# Patient Record
Sex: Male | Born: 1955
Health system: Southern US, Community
[De-identification: ages and names within clinical notes are randomized; demographics above are authoritative.]

## PROBLEM LIST (undated history)

## (undated) DIAGNOSIS — R296 Repeated falls: Secondary | ICD-10-CM

## (undated) DIAGNOSIS — G6289 Other specified polyneuropathies: Secondary | ICD-10-CM

## (undated) DIAGNOSIS — E7800 Pure hypercholesterolemia, unspecified: Secondary | ICD-10-CM

## (undated) DIAGNOSIS — H832X9 Labyrinthine dysfunction, unspecified ear: Secondary | ICD-10-CM

## (undated) DIAGNOSIS — H814 Vertigo of central origin: Secondary | ICD-10-CM

## (undated) DIAGNOSIS — W19XXXA Unspecified fall, initial encounter: Secondary | ICD-10-CM

## (undated) DIAGNOSIS — I10 Essential (primary) hypertension: Secondary | ICD-10-CM

## (undated) HISTORY — DX: Repeated falls: R29.6

## (undated) HISTORY — DX: Vertigo of central origin: H81.4

## (undated) HISTORY — DX: Essential (primary) hypertension: I10

## (undated) HISTORY — DX: Labyrinthine dysfunction, unspecified ear: H83.2X9

## (undated) HISTORY — DX: Pure hypercholesterolemia, unspecified: E78.00

## (undated) HISTORY — DX: Other specified polyneuropathies: G62.89

## (undated) HISTORY — DX: Unspecified fall, initial encounter: W19.XXXA

---
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
## 2018-12-29 DIAGNOSIS — H6123 Impacted cerumen, bilateral: Secondary | ICD-10-CM | POA: Diagnosis not present

## 2018-12-29 DIAGNOSIS — H903 Sensorineural hearing loss, bilateral: Secondary | ICD-10-CM | POA: Diagnosis not present

## 2018-12-29 DIAGNOSIS — R42 Dizziness and giddiness: Secondary | ICD-10-CM | POA: Diagnosis not present

## 2019-01-16 DIAGNOSIS — H903 Sensorineural hearing loss, bilateral: Secondary | ICD-10-CM | POA: Diagnosis not present

## 2019-01-16 DIAGNOSIS — R42 Dizziness and giddiness: Secondary | ICD-10-CM | POA: Diagnosis not present

## 2019-01-29 DIAGNOSIS — H832X3 Labyrinthine dysfunction, bilateral: Secondary | ICD-10-CM | POA: Diagnosis not present

## 2019-01-29 DIAGNOSIS — R42 Dizziness and giddiness: Secondary | ICD-10-CM | POA: Diagnosis not present

## 2019-02-01 DIAGNOSIS — R42 Dizziness and giddiness: Secondary | ICD-10-CM | POA: Diagnosis not present

## 2019-02-01 DIAGNOSIS — H832X3 Labyrinthine dysfunction, bilateral: Secondary | ICD-10-CM | POA: Diagnosis not present

## 2019-02-06 ENCOUNTER — Encounter: Payer: Self-pay | Admitting: Diagnostic Neuroimaging

## 2019-02-06 ENCOUNTER — Telehealth: Payer: Self-pay | Admitting: Diagnostic Neuroimaging

## 2019-02-06 DIAGNOSIS — H832X3 Labyrinthine dysfunction, bilateral: Secondary | ICD-10-CM | POA: Diagnosis not present

## 2019-02-06 DIAGNOSIS — R42 Dizziness and giddiness: Secondary | ICD-10-CM | POA: Diagnosis not present

## 2019-02-06 NOTE — Telephone Encounter (Signed)
Pt gave consent.  Pt understands that although there may be some limitations with this type of visit, we will take all precautions to reduce any security or privacy concerns.  Pt understands that this will be treated like an in office visit and we will file with pt's insurance, and there may be a patient responsible charge related to this service. °

## 2019-02-06 NOTE — Telephone Encounter (Signed)
Chart updated with referral notes/ 

## 2019-02-07 ENCOUNTER — Telehealth (INDEPENDENT_AMBULATORY_CARE_PROVIDER_SITE_OTHER): Payer: BC Managed Care – PPO | Admitting: Diagnostic Neuroimaging

## 2019-02-07 ENCOUNTER — Encounter: Payer: Self-pay | Admitting: Diagnostic Neuroimaging

## 2019-02-07 DIAGNOSIS — G629 Polyneuropathy, unspecified: Secondary | ICD-10-CM

## 2019-02-07 DIAGNOSIS — R269 Unspecified abnormalities of gait and mobility: Secondary | ICD-10-CM

## 2019-02-07 DIAGNOSIS — G3281 Cerebellar ataxia in diseases classified elsewhere: Secondary | ICD-10-CM

## 2019-02-07 NOTE — Progress Notes (Signed)
[facility]  PATIENT: Jesus Sparks DOB: Jul 12, 1956  REFERRING CLINICIAN: TEOH HISTORY FROM: patient  REASON FOR VISIT: new consult    HISTORICAL  CHIEF COMPLAINT:  Chief Complaint  Patient presents with  . Gait Problem  . Numbness    HISTORY OF PRESENT ILLNESS:   63 year old male here for evaluation of gait and balance difficulty.  2015 patient had onset of gait and balance difficulty.  1 night he got up from sleep to go to the bathroom in the dark and fell down.  Ever since that time he has noticed difficulty with walking in the dark.  He is also had to hold onto objects for balance and coordination.  Patient went to ENT for evaluation and was diagnosed with bilateral vestibular dysfunction.  No specific cause was found.  Patient also has been found to have lack of sensation in his lower extremities below his knees.  He denies any numbness or tingling or pain.  Denies any low back pain.  Denies any muscle weakness.  Patient has had multiple falls as a result.  No hearing loss.  No vertigo.  No headache.    REVIEW OF SYSTEMS: Full 14 system review of systems performed and negative with exception of: As per HPI.  ALLERGIES: No Known Allergies  HOME MEDICATIONS: Outpatient Medications Prior to Visit  Medication Sig Dispense Refill  . losartan (COZAAR) 50 MG tablet TK 1 T PO QD    . rosuvastatin (CRESTOR) 10 MG tablet TK 1 T PO QD     No facility-administered medications prior to visit.     PAST MEDICAL HISTORY: Past Medical History:  Diagnosis Date  . Falls   . Hypercholesterolemia   . Hypertension   . Labyrinthine dysfunction   . Vertigo of central origin   . Vestibular hypofunction     PAST SURGICAL HISTORY: History reviewed. No pertinent surgical history.  FAMILY HISTORY: No family history on file.  SOCIAL HISTORY: Social History   Socioeconomic History  . Marital status: Married    Spouse name: Not on file  . Number of  children: 4  . Years of education: Not on file  . Highest education level: Not on file  Occupational History  . Not on file  Social Needs  . Financial resource strain: Not on file  . Food insecurity    Worry: Not on file    Inability: Not on file  . Transportation needs    Medical: Not on file    Non-medical: Not on file  Tobacco Use  . Smoking status: Never Smoker  . Smokeless tobacco: Never Used  Substance and Sexual Activity  . Alcohol use: Not Currently  . Drug use: Never  . Sexual activity: Not on file  Lifestyle  . Physical activity    Days per week: Not on file    Minutes per session: Not on file  . Stress: Not on file  Relationships  . Social Herbalist on phone: Not on file    Gets together: Not on file    Attends religious service: Not on file    Active member of club or organization: Not on file    Attends meetings of clubs or organizations: Not on file    Relationship status: Not on file  . Intimate partner violence    Fear of current or ex partner: Not on file    Emotionally abused: Not on file    Physically abused: Not on file  Forced sexual activity: Not on file  Other Topics Concern  . Not on file  Social History Narrative   Lives with wife     PHYSICAL EXAM   VIDEO EXAM  GENERAL EXAM/CONSTITUTIONAL:  Vitals: There were no vitals filed for this visit.  There is no height or weight on file to calculate BMI. Wt Readings from Last 3 Encounters:  No data found for Wt     Patient is in no distress; well developed, nourished and groomed; neck is supple   NEUROLOGIC: MENTAL STATUS:  No flowsheet data found.  awake, alert, oriented to person, place and time  recent and remote memory intact  normal attention and concentration  language fluent, comprehension intact, naming intact  fund of knowledge appropriate  CRANIAL NERVE:   2nd, 3rd, 4th, 6th - visual fields full to confrontation, extraocular muscles intact, no  nystagmus  5th - facial sensation symmetric  7th - facial strength symmetric  8th - hearing intact  11th - shoulder shrug symmetric  12th - tongue protrusion midline  MOTOR:   NO TREMOR; NO DRIFT IN BUE  SENSORY:   normal and symmetric to light touch in BUE  DECR [place]:   fine finger movements normal      DIAGNOSTIC DATA (LABS, IMAGING, TESTING) - I reviewed patient records, labs, notes, testing and imaging myself where available.  No results found for: WBC, HGB, HCT, MCV, PLT No results found for: NA, K, CL, CO2, GLUCOSE, BUN, CREATININE, CALCIUM, PROT, ALBUMIN, AST, ALT, ALKPHOS, BILITOT, GFRNONAA, GFRAA No results found for: CHOL, HDL, LDLCALC, LDLDIRECT, TRIG, CHOLHDL No results found for: HGBA1C No results found for: VITAMINB12 No results found for: TSH     ASSESSMENT AND PLAN  63 y.o. year old male here with new onset of gait and balance difficulty, with suspected peripheral neuropathy in the legs, also with vestibular dysfunction based on ENT evaluation.  Dx:  1. Gait difficulty   2. Cerebellar ataxia in diseases classified elsewhere ([place])   3. Neuropathy     Virtual Visit via Video Note  I connected with Jesus Sparks on 02/07/19 at 10:30 AM EDT by a video enabled telemedicine application and verified that I am speaking with the correct person using two identifiers.  Location: Patient: home Provider: office   I discussed the limitations of evaluation and management by telemedicine and the availability of in person appointments. The patient expressed understanding and agreed to proceed.   I discussed the assessment and treatment plan with the patient. The patient was provided an opportunity to ask questions and all were answered. The patient agreed with the plan and demonstrated an understanding of the instructions.   The patient was advised to call back or seek an in-person evaluation if the symptoms  worsen or if the condition fails to improve as anticipated.  I provided 30 minutes of non-face-to-face time during this encounter.   PLAN:  BALANCE DIFFICULTY (? neuropathy vs CNS vestibular dysfunction) - check MRI brain and IAC (with and without)  - check neuropathy lab testing; may consider EMG in future  Orders Placed This Encounter  Procedures  . MR BRAIN W WO CONTRAST  . ANA,IFA RA Diag Pnl w/rflx Tit/Patn  . Multiple Myeloma Panel (SPEP&IFE w/QIG)  . Hemoglobin A1c  . TSH  . Vitamin B12   Return in about 4 months (around 06/09/2019).    Penni Bombard, MD 02/24/1974, 88:32 AM Certified in Neurology, Neurophysiology and Neuroimaging  [facility] [address] [place], [place] [address] [phone]

## 2019-02-08 ENCOUNTER — Other Ambulatory Visit (INDEPENDENT_AMBULATORY_CARE_PROVIDER_SITE_OTHER): Payer: Self-pay

## 2019-02-08 ENCOUNTER — Telehealth: Payer: Self-pay | Admitting: Diagnostic Neuroimaging

## 2019-02-08 ENCOUNTER — Other Ambulatory Visit: Payer: Self-pay

## 2019-02-08 DIAGNOSIS — H832X3 Labyrinthine dysfunction, bilateral: Secondary | ICD-10-CM | POA: Diagnosis not present

## 2019-02-08 DIAGNOSIS — R42 Dizziness and giddiness: Secondary | ICD-10-CM | POA: Diagnosis not present

## 2019-02-08 DIAGNOSIS — G3281 Cerebellar ataxia in diseases classified elsewhere: Secondary | ICD-10-CM

## 2019-02-08 DIAGNOSIS — Z0289 Encounter for other administrative examinations: Secondary | ICD-10-CM

## 2019-02-08 NOTE — Telephone Encounter (Signed)
LVM for pt to call back to give [place] the provider ph # or custormer service #  on the back of his insurance card.

## 2019-02-10 LAB — MULTIPLE MYELOMA PANEL, SERUM

## 2019-02-12 LAB — MULTIPLE MYELOMA PANEL, SERUM
Albumin SerPl Elph-Mcnc: 4 g/dL (ref 2.9–4.4)
Albumin/Glob SerPl: 1.6 (ref 0.7–1.7)
Alpha 1: 0.2 g/dL (ref 0.0–0.4)
Alpha2 Glob SerPl Elph-Mcnc: 0.7 g/dL (ref 0.4–1.0)
B-Globulin SerPl Elph-Mcnc: 0.7 g/dL (ref 0.7–1.3)
Gamma Glob SerPl Elph-Mcnc: 0.9 g/dL (ref 0.4–1.8)
Globulin, Total: 2.6 g/dL (ref 2.2–3.9)
IgA/Immunoglobulin A, Serum: 191 mg/dL (ref 90–386)
IgG (Immunoglobin G), Serum: 1109 mg/dL (ref 603–1613)
IgM (Immunoglobulin M), Srm: 98 mg/dL (ref 20–172)
Total Protein: 6.6 g/dL (ref 6.0–8.5)

## 2019-02-12 LAB — VITAMIN B12: Vitamin B-12: 268 pg/mL (ref 232–1245)

## 2019-02-12 LAB — ANA,IFA RA DIAG PNL W/RFLX TIT/PATN
ANA Titer 1: NEGATIVE
Cyclic Citrullin Peptide Ab: 5 units (ref 0–19)
Rheumatoid fact SerPl-aCnc: 12.2 IU/mL (ref 0.0–13.9)

## 2019-02-12 LAB — HEMOGLOBIN A1C
Est. average glucose Bld gHb Est-mCnc: 117 mg/dL
Hgb A1c MFr Bld: 5.7 % — ABNORMAL HIGH (ref 4.8–5.6)

## 2019-02-12 LAB — TSH: TSH: 1.36 u[IU]/mL (ref 0.450–4.500)

## 2019-02-13 ENCOUNTER — Telehealth: Payer: Self-pay | Admitting: *Deleted

## 2019-02-13 NOTE — Telephone Encounter (Signed)
no to the covid-19 MR Brain w/wo contrast Dr. Conley Simmonds Auth: [place] Ref # [id number]. Patient is scheduled at [facility] for 02/14/19.

## 2019-02-13 NOTE — Telephone Encounter (Signed)
Spoke with patient and informed him his labs are unremarkable. His B12 level is low normal, so Dr Leta Baptist recommends OTC B12 supplement. He asked about his MRI; I noted Emily LVM for back of insurance card info. He stated e came for labs and got it scanned. I Merrilee Jansky and advised her. She stated she would work on his MRI approval today and call him. I relayed this to patient. Patient verbalized understanding, appreciation.

## 2019-02-14 ENCOUNTER — Ambulatory Visit: Payer: BC Managed Care – PPO

## 2019-02-14 ENCOUNTER — Other Ambulatory Visit: Payer: Self-pay

## 2019-02-14 DIAGNOSIS — R42 Dizziness and giddiness: Secondary | ICD-10-CM | POA: Diagnosis not present

## 2019-02-14 DIAGNOSIS — H832X3 Labyrinthine dysfunction, bilateral: Secondary | ICD-10-CM | POA: Diagnosis not present

## 2019-02-14 DIAGNOSIS — G3281 Cerebellar ataxia in diseases classified elsewhere: Secondary | ICD-10-CM

## 2019-02-14 MED ORDER — GADOBENATE DIMEGLUMINE 529 MG/ML IV SOLN
15.0000 mL | Freq: Once | INTRAVENOUS | Status: AC | PRN
  Administered 2019-02-14: 15 mL via INTRAVENOUS

## 2019-02-15 ENCOUNTER — Telehealth: Payer: Self-pay | Admitting: *Deleted

## 2019-02-15 NOTE — Telephone Encounter (Signed)
Called patient and scheduled 4 month follow up. Patient verbalized understanding, appreciation.

## 2019-02-19 DIAGNOSIS — R42 Dizziness and giddiness: Secondary | ICD-10-CM | POA: Diagnosis not present

## 2019-02-19 DIAGNOSIS — H832X3 Labyrinthine dysfunction, bilateral: Secondary | ICD-10-CM | POA: Diagnosis not present

## 2019-02-20 ENCOUNTER — Telehealth: Payer: Self-pay | Admitting: *Deleted

## 2019-02-20 NOTE — Telephone Encounter (Signed)
Called patient and informed him his MRI brain results are unremarkable. He stated he is taking B12 now. He  verbalized understanding, appreciation.

## 2019-02-22 DIAGNOSIS — R42 Dizziness and giddiness: Secondary | ICD-10-CM | POA: Diagnosis not present

## 2019-02-22 DIAGNOSIS — H832X3 Labyrinthine dysfunction, bilateral: Secondary | ICD-10-CM | POA: Diagnosis not present

## 2019-03-28 DIAGNOSIS — R42 Dizziness and giddiness: Secondary | ICD-10-CM | POA: Diagnosis not present

## 2019-03-28 DIAGNOSIS — H832X3 Labyrinthine dysfunction, bilateral: Secondary | ICD-10-CM | POA: Diagnosis not present

## 2019-04-05 DIAGNOSIS — Z125 Encounter for screening for malignant neoplasm of prostate: Secondary | ICD-10-CM | POA: Diagnosis not present

## 2019-04-05 DIAGNOSIS — E7849 Other hyperlipidemia: Secondary | ICD-10-CM | POA: Diagnosis not present

## 2019-04-05 DIAGNOSIS — I10 Essential (primary) hypertension: Secondary | ICD-10-CM | POA: Diagnosis not present

## 2019-04-05 DIAGNOSIS — Z0000 Encounter for general adult medical examination without abnormal findings: Secondary | ICD-10-CM | POA: Diagnosis not present

## 2019-04-11 DIAGNOSIS — R208 Other disturbances of skin sensation: Secondary | ICD-10-CM | POA: Diagnosis not present

## 2019-04-11 DIAGNOSIS — I10 Essential (primary) hypertension: Secondary | ICD-10-CM | POA: Diagnosis not present

## 2019-04-11 DIAGNOSIS — H818X3 Other disorders of vestibular function, bilateral: Secondary | ICD-10-CM | POA: Diagnosis not present

## 2019-04-11 DIAGNOSIS — Z0000 Encounter for general adult medical examination without abnormal findings: Secondary | ICD-10-CM | POA: Diagnosis not present

## 2019-04-11 DIAGNOSIS — H832X3 Labyrinthine dysfunction, bilateral: Secondary | ICD-10-CM | POA: Diagnosis not present

## 2019-04-11 DIAGNOSIS — R42 Dizziness and giddiness: Secondary | ICD-10-CM | POA: Diagnosis not present

## 2019-04-11 DIAGNOSIS — E785 Hyperlipidemia, unspecified: Secondary | ICD-10-CM | POA: Diagnosis not present

## 2019-04-11 DIAGNOSIS — R911 Solitary pulmonary nodule: Secondary | ICD-10-CM | POA: Diagnosis not present

## 2019-06-04 ENCOUNTER — Ambulatory Visit (INDEPENDENT_AMBULATORY_CARE_PROVIDER_SITE_OTHER): Payer: BC Managed Care – PPO | Admitting: Diagnostic Neuroimaging

## 2019-06-04 ENCOUNTER — Encounter: Payer: Self-pay | Admitting: Diagnostic Neuroimaging

## 2019-06-04 ENCOUNTER — Other Ambulatory Visit: Payer: Self-pay

## 2019-06-04 VITALS — BP 145/78 | HR 93 | Temp 97.9°F | Ht 68.5 in | Wt 147.6 lb

## 2019-06-04 DIAGNOSIS — G629 Polyneuropathy, unspecified: Secondary | ICD-10-CM | POA: Diagnosis not present

## 2019-06-04 DIAGNOSIS — R269 Unspecified abnormalities of gait and mobility: Secondary | ICD-10-CM | POA: Diagnosis not present

## 2019-06-04 NOTE — Progress Notes (Signed)
[facility]  PATIENT: Jesus MartiniBruce Sparks DOB: 07/13/1956  REFERRING CLINICIAN: TEOH HISTORY FROM: patient  REASON FOR VISIT: follow up    HISTORICAL  CHIEF COMPLAINT:  Chief Complaint  Patient presents with  . Gait Problem    rm 6, 4 month FU, "completed PT for balance, no feeling in lower legs, fell last Sat"    HISTORY OF PRESENT ILLNESS:   UPDATE (06/04/19, VRP): Since last visit, doing about the same. Symptoms are progressive. Severity is moderate. No alleviating or aggravating factors. Numbness and gait diff continue. Also having more smell and taste dysfunction. Symptoms started 5-10 years ago.     PRIOR HPI (02/07/19): 63 year old male here for evaluation of gait and balance difficulty.  2015 patient had onset of gait and balance difficulty.  1 night he got up from sleep to go to the bathroom in the dark and fell down.  Ever since that time he has noticed difficulty with walking in the dark.  He is also had to hold onto objects for balance and coordination.  Patient went to ENT for evaluation and was diagnosed with bilateral vestibular dysfunction.  No specific cause was found.  Patient also has been found to have lack of sensation in his lower extremities below his knees.  He denies any numbness or tingling or pain.  Denies any low back pain.  Denies any muscle weakness.  Patient has had multiple falls as a result.  No hearing loss.  No vertigo.  No headache.   REVIEW OF SYSTEMS: Full 14 system review of systems performed and negative with exception of: as per HPI.   ALLERGIES: No Known Allergies  HOME MEDICATIONS: Outpatient Medications Prior to Visit  Medication Sig Dispense Refill  . loratadine (CLARITIN) 10 MG tablet Take 10 mg by mouth daily.    Marland Kitchen. losartan (COZAAR) 50 MG tablet TK 1 T PO QD    . rosuvastatin (CRESTOR) 10 MG tablet TK 1 T PO QD    . Cyanocobalamin (VITAMIN B-12 CR) 1000 MCG TBCR Take by mouth.     No facility-administered  medications prior to visit.     PAST MEDICAL HISTORY: Past Medical History:  Diagnosis Date  . Falls   . Hypercholesterolemia   . Hypertension   . Labyrinthine dysfunction   . Vertigo of central origin   . Vestibular hypofunction     PAST SURGICAL HISTORY: Past Surgical History:  Procedure Laterality Date  . TONSILLECTOMY     child    FAMILY HISTORY: Family History  Problem Relation Age of Onset  . Pulmonary fibrosis Father     SOCIAL HISTORY: Social History   Socioeconomic History  . Marital status: Married    Spouse name: Not on file  . Number of children: 4  . Years of education: Not on file  . Highest education level: Master's degree (e.g., MA, MS, MEng, MEd, MSW, MBA)  Occupational History    Comment: [employer]  Social Needs  . Financial resource strain: Not on file  . Food insecurity    Worry: Not on file    Inability: Not on file  . Transportation needs    Medical: Not on file    Non-medical: Not on file  Tobacco Use  . Smoking status: Never Smoker  . Smokeless tobacco: Never Used  Substance and Sexual Activity  . Alcohol use: Not Currently  . Drug use: Never  . Sexual activity: Not on file  Lifestyle  . Physical activity  Days per week: Not on file    Minutes per session: Not on file  . Stress: Not on file  Relationships  . Social Musician on phone: Not on file    Gets together: Not on file    Attends religious service: Not on file    Active member of club or organization: Not on file    Attends meetings of clubs or organizations: Not on file    Relationship status: Not on file  . Intimate partner violence    Fear of current or ex partner: Not on file    Emotionally abused: Not on file    Physically abused: Not on file    Forced sexual activity: Not on file  Other Topics Concern  . Not on file  Social History Narrative   Lives with wife   Caffeine- coffee, 3 cups daily     PHYSICAL EXAM  GENERAL  EXAM/CONSTITUTIONAL: Vitals:  Vitals:   06/04/19 1609  BP: (!) 145/78  Pulse: 93  Temp: 97.9 F (36.6 C)  Weight: 147 lb 9.6 oz (67 kg)  Height: 5' 8.5" (1.74 m)     Body mass index is 22.12 kg/m. Wt Readings from Last 3 Encounters:  06/04/19 147 lb 9.6 oz (67 kg)     Patient is in no distress; well developed, nourished and groomed; neck is supple  CARDIOVASCULAR:  Examination of carotid arteries is normal; no carotid bruits  Regular rate and rhythm, no murmurs  Examination of peripheral vascular system by observation and palpation is normal  EYES:  Ophthalmoscopic exam of optic discs and posterior segments is normal; no papilledema or hemorrhages  No exam data present  MUSCULOSKELETAL:  Gait, strength, tone, movements noted in Neurologic exam below  NEUROLOGIC: MENTAL STATUS:  No flowsheet data found.  awake, alert, oriented to person, place and time  recent and remote memory intact  normal attention and concentration  language fluent, comprehension intact, naming intact  fund of knowledge appropriate  CRANIAL NERVE:   2nd - no papilledema on fundoscopic exam  2nd, 3rd, 4th, 6th - pupils equal and reactive to light, visual fields full to confrontation, extraocular muscles intact, no nystagmus  5th - facial sensation symmetric  7th - facial strength symmetric  8th - hearing intact  9th - palate elevates symmetrically, uvula midline  11th - shoulder shrug symmetric  12th - tongue protrusion midline  MOTOR:   normal bulk and tone, full strength in the BUE, BLE  SENSORY:   normal and symmetric to light touch; DECR PP, TEMP, VIB IN HANDS AND FEET  COORDINATION:   finger-nose-finger, fine finger movements normal  REFLEXES:   deep tendon reflexes --> ABSENT IN UPPER AND LOWER EXT  GAIT/STATION:   narrow based gait; SLIGHT DECR STABILITY WITH TURNING; romberg is BORDERLINE       DIAGNOSTIC DATA (LABS, IMAGING, TESTING) - I  reviewed patient records, labs, notes, testing and imaging myself where available.  No results found for: WBC, HGB, HCT, MCV, PLT    Component Value Date/Time   PROT 6.6 02/08/2019 1118   No results found for: CHOL, HDL, LDLCALC, LDLDIRECT, TRIG, CHOLHDL Lab Results  Component Value Date   HGBA1C 5.7 (H) 02/08/2019   Lab Results  Component Value Date   VITAMINB12 268 02/08/2019   Lab Results  Component Value Date   TSH 1.360 02/08/2019     02/14/19 MRI brain  - Mild scattered periventricular and subcortical foci of chronic small vessel  ischemic disease.  - No acute findings.    ASSESSMENT AND PLAN  63 y.o. year old male here with new onset of gait and balance difficulty, with suspected peripheral neuropathy in the legs, also with vestibular dysfunction based on ENT evaluation.  Dx:  1. Neuropathy   2. Gait difficulty      PLAN:  BALANCE DIFFICULTY (neuropathy + CNS vestibular dysfunction) - EMG/NCS testing - continue PT exercises  Orders Placed This Encounter  Procedures  . NCV with EMG(electromyography)   Return for for NCV/EMG.    Penni Bombard, MD 01/75/1025, 8:52 PM Certified in Neurology, Neurophysiology and Neuroimaging  [facility] [address] [place], [place] [address] [phone]

## 2019-06-21 ENCOUNTER — Ambulatory Visit (INDEPENDENT_AMBULATORY_CARE_PROVIDER_SITE_OTHER): Payer: BC Managed Care – PPO | Admitting: Diagnostic Neuroimaging

## 2019-06-21 ENCOUNTER — Encounter: Payer: BC Managed Care – PPO | Admitting: Diagnostic Neuroimaging

## 2019-06-21 ENCOUNTER — Other Ambulatory Visit: Payer: Self-pay

## 2019-06-21 DIAGNOSIS — Z0289 Encounter for other administrative examinations: Secondary | ICD-10-CM

## 2019-06-21 DIAGNOSIS — G629 Polyneuropathy, unspecified: Secondary | ICD-10-CM | POA: Diagnosis not present

## 2019-06-25 NOTE — Procedures (Signed)
[facility]  NCS (NERVE CONDUCTION STUDY) WITH EMG (ELECTROMYOGRAPHY) REPORT   STUDY DATE: 06/21/19 PATIENT NAME: Jesus Sparks DOB: 23-Jan-1956 MRN: [id number]  ORDERING CLINICIAN: Joycelyn Schmid, MD   TECHNOLOGIST: Durenda Age ELECTROMYOGRAPHER: Glenford Bayley. , MD  CLINICAL INFORMATION: 63 year old male with numbness and gait difficulty.  FINDINGS: NERVE CONDUCTION STUDY:  Left ulnar motor response is normal.  Bilateral peroneal motor responses have borderline slow conduction velocities, otherwise normal.  Left tibial motor response has normal distal latency, decreased amplitude, normal conduction velocity, and temporal dispersion.  Right tibial motor response is normal except for temporal dispersion with proximal stimulation.  Left radial, right sural, right superficial peroneal and left ulnar sensory responses have decreased amplitudes.  Left sural and left superficial peroneal sensory responses could not be obtained.   NEEDLE ELECTROMYOGRAPHY:  Left gastrocnemius has 1+ positive sharp waves and fibrillation potentials at rest.  Normal motor unit recruitment.  Left vastus medialis and tibialis anterior muscles are normal.   IMPRESSION:   Abnormal study demonstrating: - Axonal sensorimotor polyneuropathy.   INTERPRETING PHYSICIAN:  Suanne Marker, MD Certified in Neurology, Neurophysiology and Neuroimaging  [facility] [address] [place], [place] [address] [phone]   [facility]    Nerve / Sites Muscle Latency Ref. Amplitude Ref. Rel Amp Segments Distance Velocity Ref. Area    ms ms mV mV %  cm m/s m/s mVms  L Ulnar - ADM     Wrist ADM 2.2 ?3.3 7.7 ?6.0 100 Wrist - ADM 7   24.5     B.Elbow ADM 5.8  6.0  78.2 B.Elbow - Wrist 20 56 ?49 21.2     A.Elbow ADM 7.6  5.7  94.8 A.Elbow - B.Elbow 10 58 ?49 20.2         A.Elbow - Wrist      L Peroneal - EDB     Ankle EDB 4.5 ?6.5 4.5 ?2.0 100 Ankle - EDB  9   13.9     Fib head EDB 11.2  3.5  78.9 Fib head - Ankle 28 42 ?44 12.0     Pop fossa EDB 13.6  3.7  106 Pop fossa - Fib head 10 41 ?44 13.1         Pop fossa - Ankle      R Peroneal - EDB     Ankle EDB 5.5 ?6.5 4.7 ?2.0 100 Ankle - EDB 9   13.7     Fib head EDB 12.5  3.5  73.7 Fib head - Ankle 29 41 ?44 10.9     Pop fossa EDB 15.0  2.8  81.4 Pop fossa - Fib head 10 40 ?44 10.2         Pop fossa - Ankle      L Tibial - AH     Ankle AH 3.9 ?5.8 1.8 ?4.0 100 Ankle - AH 9   10.5     Pop fossa AH 13.5  1.4  76.2 Pop fossa - Ankle 39 41 ?41 13.1  R Tibial - AH     Ankle AH 4.3 ?5.8 5.6 ?4.0 100 Ankle - AH 9   13.9     Pop fossa AH 13.8  2.5  45.1 Pop fossa - Ankle 39 41 ?41 7.7               SNC    Nerve / Sites Rec. Site Peak Lat Ref.  Amp Ref. Segments Distance  ms ms V V  cm  L Radial - Anatomical snuff box (Forearm)     Forearm Wrist 2.3 ?2.9 6 ?15 Forearm - Wrist 10  L Sural - Ankle (Calf)     Calf Ankle NR ?4.4 NR ?6 Calf - Ankle 14  R Sural - Ankle (Calf)     Calf Ankle 2.7 ?4.4 4 ?6 Calf - Ankle 14  L Superficial peroneal - Ankle     Lat leg Ankle NR ?4.4 NR ?6 Lat leg - Ankle 14  R Superficial peroneal - Ankle     Lat leg Ankle 4.2 ?4.4 2 ?6 Lat leg - Ankle 14  L Ulnar - Orthodromic, (Dig V, Mid palm)     Dig V Wrist 2.6 ?3.1 3 ?5 Dig V - Wrist 66                  F  Wave    Nerve F Lat Ref.   ms ms  L Tibial - AH 55.4 ?56.0  R Tibial - AH 55.6 ?56.0  L Ulnar - ADM 27.3 ?32.0           EMG full       EMG Summary Table    Spontaneous MUAP Recruitment  Muscle IA Fib PSW Fasc Other Amp Dur. Poly Pattern  L. Vastus medialis Normal None None None _______ Normal Normal Normal Normal  L. Tibialis anterior Normal None None None _______ Normal Normal Normal Normal  L. Gastrocnemius (Medial head) Normal 1+ 1+ None _______ Normal Normal Normal Normal

## 2020-03-31 DIAGNOSIS — Z20828 Contact with and (suspected) exposure to other viral communicable diseases: Secondary | ICD-10-CM | POA: Diagnosis not present

## 2020-03-31 DIAGNOSIS — R509 Fever, unspecified: Secondary | ICD-10-CM | POA: Diagnosis not present

## 2020-03-31 DIAGNOSIS — R5383 Other fatigue: Secondary | ICD-10-CM | POA: Diagnosis not present

## 2020-04-06 DIAGNOSIS — U071 COVID-19: Secondary | ICD-10-CM | POA: Diagnosis not present

## 2020-12-14 HISTORY — PX: BACK SURGERY: SHX140

## 2020-12-16 DIAGNOSIS — S39012A Strain of muscle, fascia and tendon of lower back, initial encounter: Secondary | ICD-10-CM | POA: Diagnosis not present

## 2020-12-17 DIAGNOSIS — M5137 Other intervertebral disc degeneration, lumbosacral region: Secondary | ICD-10-CM | POA: Diagnosis not present

## 2020-12-17 DIAGNOSIS — M25361 Other instability, right knee: Secondary | ICD-10-CM | POA: Diagnosis not present

## 2020-12-17 DIAGNOSIS — G629 Polyneuropathy, unspecified: Secondary | ICD-10-CM | POA: Diagnosis not present

## 2020-12-17 DIAGNOSIS — E785 Hyperlipidemia, unspecified: Secondary | ICD-10-CM | POA: Diagnosis not present

## 2020-12-19 ENCOUNTER — Other Ambulatory Visit: Payer: Self-pay | Admitting: Family Medicine

## 2020-12-19 ENCOUNTER — Other Ambulatory Visit (HOSPITAL_COMMUNITY): Payer: Self-pay | Admitting: Family Medicine

## 2020-12-19 DIAGNOSIS — M5137 Other intervertebral disc degeneration, lumbosacral region: Secondary | ICD-10-CM

## 2020-12-22 ENCOUNTER — Ambulatory Visit (HOSPITAL_COMMUNITY)
Admission: RE | Admit: 2020-12-22 | Discharge: 2020-12-22 | Disposition: A | Discharge status: Home / Self Care | Payer: BC Managed Care – PPO | Source: Ambulatory Visit | Attending: Family Medicine | Admitting: Family Medicine

## 2020-12-22 ENCOUNTER — Other Ambulatory Visit (HOSPITAL_COMMUNITY): Payer: Self-pay | Admitting: Family Medicine

## 2020-12-22 ENCOUNTER — Other Ambulatory Visit: Payer: Self-pay

## 2020-12-22 ENCOUNTER — Other Ambulatory Visit: Payer: Self-pay | Admitting: Family Medicine

## 2020-12-22 DIAGNOSIS — M5137 Other intervertebral disc degeneration, lumbosacral region: Secondary | ICD-10-CM | POA: Insufficient documentation

## 2020-12-22 DIAGNOSIS — M545 Low back pain, unspecified: Secondary | ICD-10-CM | POA: Diagnosis not present

## 2020-12-22 MED ORDER — GADOBUTROL 1 MMOL/ML IV SOLN
7.0000 mL | Freq: Once | INTRAVENOUS | Status: AC | PRN
  Administered 2020-12-22: 7 mL via INTRAVENOUS

## 2020-12-29 DIAGNOSIS — M5106 Intervertebral disc disorders with myelopathy, lumbar region: Secondary | ICD-10-CM | POA: Diagnosis not present

## 2020-12-31 DIAGNOSIS — G4733 Obstructive sleep apnea (adult) (pediatric): Secondary | ICD-10-CM | POA: Diagnosis not present

## 2020-12-31 DIAGNOSIS — G4730 Sleep apnea, unspecified: Secondary | ICD-10-CM | POA: Diagnosis not present

## 2020-12-31 DIAGNOSIS — I10 Essential (primary) hypertension: Secondary | ICD-10-CM | POA: Diagnosis not present

## 2020-12-31 DIAGNOSIS — M5416 Radiculopathy, lumbar region: Secondary | ICD-10-CM | POA: Diagnosis not present

## 2020-12-31 DIAGNOSIS — R0683 Snoring: Secondary | ICD-10-CM | POA: Diagnosis not present

## 2020-12-31 DIAGNOSIS — Z9189 Other specified personal risk factors, not elsewhere classified: Secondary | ICD-10-CM | POA: Diagnosis not present

## 2021-01-07 DIAGNOSIS — E785 Hyperlipidemia, unspecified: Secondary | ICD-10-CM | POA: Diagnosis not present

## 2021-01-07 DIAGNOSIS — M5106 Intervertebral disc disorders with myelopathy, lumbar region: Secondary | ICD-10-CM | POA: Diagnosis not present

## 2021-01-07 DIAGNOSIS — H8190 Unspecified disorder of vestibular function, unspecified ear: Secondary | ICD-10-CM | POA: Diagnosis not present

## 2021-01-07 DIAGNOSIS — I10 Essential (primary) hypertension: Secondary | ICD-10-CM | POA: Diagnosis not present

## 2021-01-07 DIAGNOSIS — R7303 Prediabetes: Secondary | ICD-10-CM | POA: Diagnosis not present

## 2021-01-07 DIAGNOSIS — M5116 Intervertebral disc disorders with radiculopathy, lumbar region: Secondary | ICD-10-CM | POA: Diagnosis not present

## 2021-02-02 ENCOUNTER — Other Ambulatory Visit: Payer: Self-pay | Admitting: *Deleted

## 2021-02-02 ENCOUNTER — Encounter: Payer: Self-pay | Admitting: *Deleted

## 2021-02-04 ENCOUNTER — Encounter: Payer: Self-pay | Admitting: Diagnostic Neuroimaging

## 2021-02-04 ENCOUNTER — Ambulatory Visit (INDEPENDENT_AMBULATORY_CARE_PROVIDER_SITE_OTHER): Payer: BC Managed Care – PPO | Admitting: Diagnostic Neuroimaging

## 2021-02-04 VITALS — BP 133/83 | HR 68 | Ht 68.0 in | Wt 143.4 lb

## 2021-02-04 DIAGNOSIS — G629 Polyneuropathy, unspecified: Secondary | ICD-10-CM

## 2021-02-04 DIAGNOSIS — R269 Unspecified abnormalities of gait and mobility: Secondary | ICD-10-CM

## 2021-02-04 NOTE — Progress Notes (Signed)
[facility]  PATIENT: Jesus Sparks DOB: July 24, 1956  REFERRING CLINICIAN: DrinkardFannie Knee, NP  HISTORY FROM: patient  REASON FOR VISIT: follow up    HISTORICAL  CHIEF COMPLAINT:  Chief Complaint  Patient presents with   Polyneuropathy    Rm 6 "no improvements in my condition, don't remember getting NCS results; PCP office wanted me to see neurology again"     HISTORY OF PRESENT ILLNESS:   UPDATE (02/04/21, VRP): Since last visit, doing about the same, except had right lumbar disc herniation in May 2022, confirmed on MRI, then had surgery 01/07/21. Has had good recovery. Balance issues are stable.   UPDATE (06/04/19, VRP): Since last visit, doing about the same. Symptoms are progressive. Severity is moderate. No alleviating or aggravating factors. Numbness and gait diff continue. Also having more smell and taste dysfunction. Symptoms started 5-10 years ago.     PRIOR HPI (02/07/19): 65 year old male here for evaluation of gait and balance difficulty.  2015 patient had onset of gait and balance difficulty.  1 night he got up from sleep to go to the bathroom in the dark and fell down.  Ever since that time he has noticed difficulty with walking in the dark.  He is also had to hold onto objects for balance and coordination.  Patient went to ENT for evaluation and was diagnosed with bilateral vestibular dysfunction.  No specific cause was found.  Patient also has been found to have lack of sensation in his lower extremities below his knees.  He denies any numbness or tingling or pain.  Denies any low back pain.  Denies any muscle weakness.  Patient has had multiple falls as a result.  No hearing loss.  No vertigo.  No headache.   REVIEW OF SYSTEMS: Full 14 system review of systems performed and negative with exception of: as per HPI.   ALLERGIES: No Known Allergies  HOME MEDICATIONS: Outpatient Medications Prior to Visit  Medication Sig Dispense Refill    Cyanocobalamin (VITAMIN B-12 CR) 1000 MCG TBCR Take by mouth. (Patient not taking: Reported on 02/04/2021)     loratadine (CLARITIN) 10 MG tablet Take 10 mg by mouth daily.     losartan (COZAAR) 50 MG tablet Take 50 mg by mouth daily.     rosuvastatin (CRESTOR) 10 MG tablet TK 1 T PO QD     No facility-administered medications prior to visit.    PAST MEDICAL HISTORY: Past Medical History:  Diagnosis Date   Axonal sensorimotor neuropathy    per EMG   Falls    Hypercholesterolemia    Hypertension    Labyrinthine dysfunction    Vertigo of central origin    Vestibular hypofunction     PAST SURGICAL HISTORY: Past Surgical History:  Procedure Laterality Date   BACK SURGERY  12/2020   herniated disk   TONSILLECTOMY  1966   child    FAMILY HISTORY: Family History  Problem Relation Age of Onset   Pulmonary fibrosis Father     SOCIAL HISTORY: Social History   Socioeconomic History   Marital status: Married    Spouse name: Verlon Au   Number of children: 4   Years of education: Not on file   Highest education level: Master's degree (e.g., MA, MS, MEng, MEd, MSW, MBA)  Occupational History    Comment: [employer]  Tobacco Use   Smoking status: Never   Smokeless tobacco: Never  Substance and Sexual Activity   Alcohol use: Not Currently   Drug  use: Never   Sexual activity: Not on file  Other Topics Concern   Not on file  Social History Narrative   Lives with wife   Caffeine- coffee, 3 cups daily   Social Determinants of Health   Financial Resource Strain: Not on file  Food Insecurity: Not on file  Transportation Needs: Not on file  Physical Activity: Not on file  Stress: Not on file  Social Connections: Not on file  Intimate Partner Violence: Not on file     PHYSICAL EXAM  GENERAL EXAM/CONSTITUTIONAL: Vitals:  Vitals:   02/04/21 1322  BP: 133/83  Pulse: 68  Weight: 143 lb 6.4 oz (65 kg)  Height: 5\' 8"  (1.727 m)   Body mass index is 21.8  kg/m. Wt Readings from Last 3 Encounters:  02/04/21 143 lb 6.4 oz (65 kg)  06/04/19 147 lb 9.6 oz (67 kg)   Patient is in no distress; well developed, nourished and groomed; neck is supple  CARDIOVASCULAR: Examination of carotid arteries is normal; no carotid bruits Regular rate and rhythm, no murmurs Examination of peripheral vascular system by observation and palpation is normal  EYES: Ophthalmoscopic exam of optic discs and posterior segments is normal; no papilledema or hemorrhages No results found.  MUSCULOSKELETAL: Gait, strength, tone, movements noted in Neurologic exam below  NEUROLOGIC: MENTAL STATUS:  No flowsheet data found. awake, alert, oriented to person, place and time recent and remote memory intact normal attention and concentration language fluent, comprehension intact, naming intact fund of knowledge appropriate  CRANIAL NERVE:  2nd - no papilledema on fundoscopic exam 2nd, 3rd, 4th, 6th - pupils equal and reactive to light, visual fields full to confrontation, extraocular muscles intact, no nystagmus 5th - facial sensation symmetric 7th - facial strength symmetric 8th - hearing intact 9th - palate elevates symmetrically, uvula midline 11th - shoulder shrug symmetric 12th - tongue protrusion midline  MOTOR:  normal bulk and tone, full strength in the BUE, BLE  SENSORY:  normal and symmetric to light touch; DECR PP, TEMP, VIB IN HANDS AND FEET  COORDINATION:  finger-nose-finger, fine finger movements normal  REFLEXES:  deep tendon reflexes --> ABSENT IN UPPER AND LOWER EXT  GAIT/STATION:  narrow based gait; SLIGHT DECR STABILITY WITH TURNING; romberg is BORDERLINE       DIAGNOSTIC DATA (LABS, IMAGING, TESTING) - I reviewed patient records, labs, notes, testing and imaging myself where available.  No results found for: WBC, HGB, HCT, MCV, PLT    Component Value Date/Time   PROT 6.6 02/08/2019 1118   No results found for: CHOL, HDL,  LDLCALC, LDLDIRECT, TRIG, CHOLHDL Lab Results  Component Value Date   HGBA1C 5.7 (H) 02/08/2019   Lab Results  Component Value Date   VITAMINB12 268 02/08/2019   Lab Results  Component Value Date   TSH 1.360 02/08/2019      02/14/19 MRI brain  - Mild scattered periventricular and subcortical foci of chronic small vessel ischemic disease.  - No acute findings.  06/21/19 EMG/NCS  Abnormal study demonstrating: - Axonal sensorimotor polyneuropathy.  12/23/19 MRI lumbar spine 1. There is a moderate to large right-sided disc extrusion at L2-3 with probable free fragment coursing down behind the L3 vertebral body. There is significant mass effect on the right side of the thecal sac likely affecting the right L3 nerve root. 2. Mild bilateral lateral recess stenosis at L3-4. 3. Focal shallow right foraminal/extraforaminal disc protrusion at L4-5 which comes close to contacting and may slightly displace the right L4  nerve root extra foraminally.    ASSESSMENT AND PLAN  65 y.o. year old male here with new onset of gait and balance difficulty, with suspected peripheral neuropathy in the legs, also with non-specific vestibular dysfunction based on ENT evaluation.  Dx:  1. Neuropathy   2. Gait difficulty      PLAN:  BALANCE DIFFICULTY (idiopathic polyneuropathy) - continue PT exercises; fall precautions reviewed  Return for return to PCP, pending if symptoms worsen or fail to improve.    Suanne Marker, MD 02/04/2021, 2:14 PM Certified in Neurology, Neurophysiology and Neuroimaging  [facility] [address], [address] [place], [place] [address] [phone]

## 2021-03-20 DIAGNOSIS — S60562A Insect bite (nonvenomous) of left hand, initial encounter: Secondary | ICD-10-CM | POA: Diagnosis not present

## 2021-03-20 DIAGNOSIS — S60561A Insect bite (nonvenomous) of right hand, initial encounter: Secondary | ICD-10-CM | POA: Diagnosis not present

## 2021-05-21 DIAGNOSIS — R0681 Apnea, not elsewhere classified: Secondary | ICD-10-CM | POA: Diagnosis not present

## 2021-05-21 DIAGNOSIS — I10 Essential (primary) hypertension: Secondary | ICD-10-CM | POA: Diagnosis not present

## 2021-05-21 DIAGNOSIS — R0683 Snoring: Secondary | ICD-10-CM | POA: Diagnosis not present

## 2021-05-21 DIAGNOSIS — Z9189 Other specified personal risk factors, not elsewhere classified: Secondary | ICD-10-CM | POA: Diagnosis not present

## 2021-07-17 DIAGNOSIS — G4733 Obstructive sleep apnea (adult) (pediatric): Secondary | ICD-10-CM | POA: Diagnosis not present

## 2021-07-17 DIAGNOSIS — G4730 Sleep apnea, unspecified: Secondary | ICD-10-CM | POA: Diagnosis not present

## 2021-07-31 IMAGING — MR MR LUMBAR SPINE WO/W CM
6 of 7 series · 30 of 48 positions shown · IV contrast ([ID] GADAVIST)
Comparison: None.

CLINICAL DATA: Back pain.  Knee injury last week.

EXAM:
MRI LUMBAR SPINE WITHOUT AND WITH CONTRAST
TECHNIQUE: Multiplanar and multiecho pulse sequences of the lumbar spine were
obtained without and with intravenous contrast.
CONTRAST:  7mL GADAVIST GADOBUTROL 1 MMOL/ML IV SOLN

[Series 5: T1 · sagittal · 4.0mm · 0.81mm/px · 5 of 15 slices shown (1 of 2)]
[im 1/15]
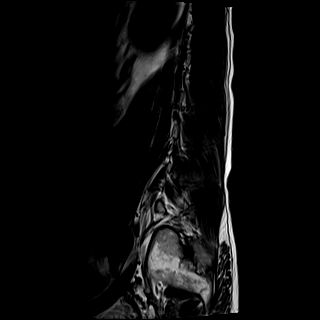
[im 4/15]
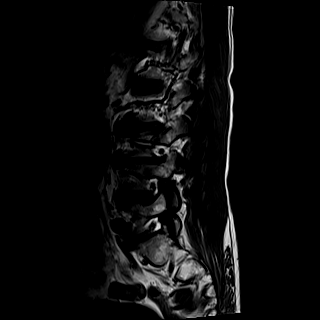
[im 8/15]
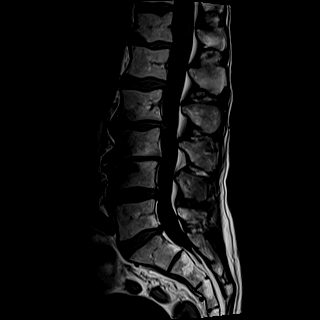
[im 11/15]
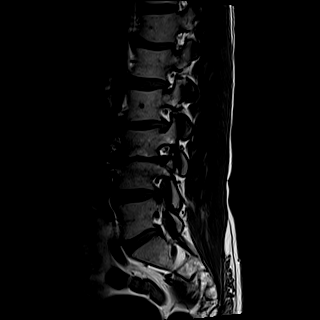
[im 15/15]
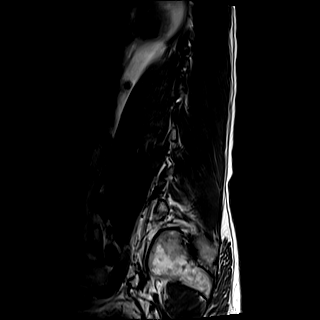

[Series 6: STIR · sagittal · 4.0mm · 0.51mm/px · 1 of 15 slices shown]
[im 1/15]
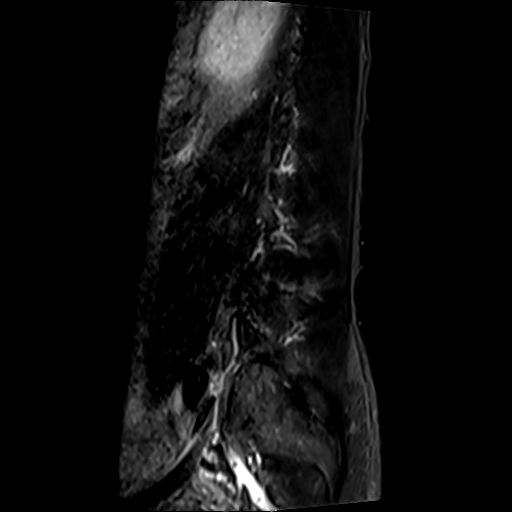

[Series 7: T2 · axial · 4.0mm · 0.62mm/px · z∈[-147,+58]mm · 8 of 35 slices shown]
[im 1/35]
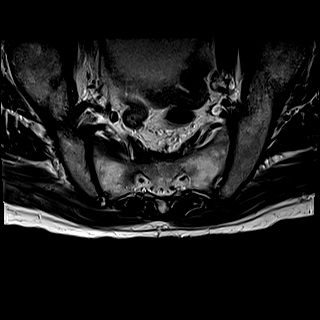
[im 4/35]
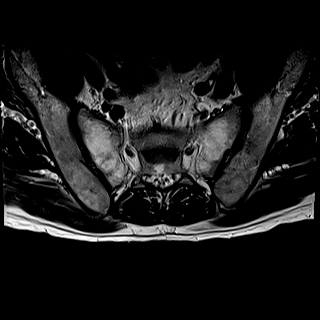
[im 12/35]
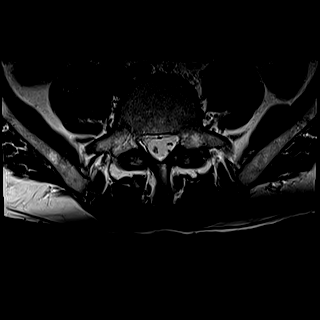
[im 16/35]
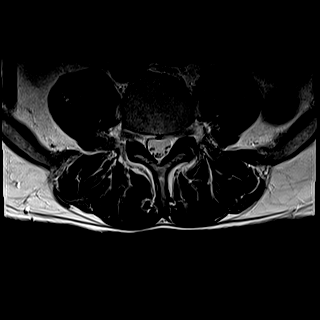
[im 19/35]
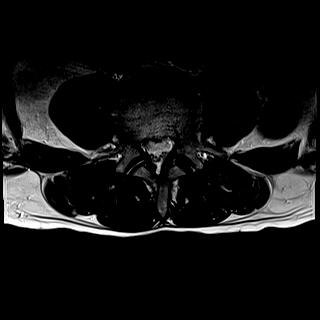
[im 23/35]
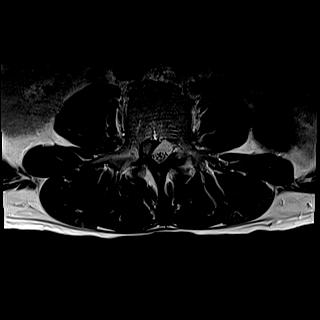
[im 31/35]
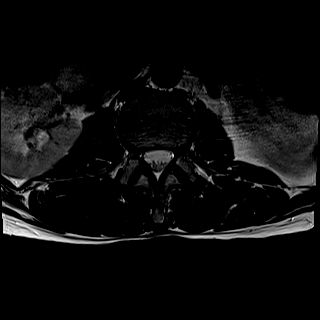
[im 35/35]
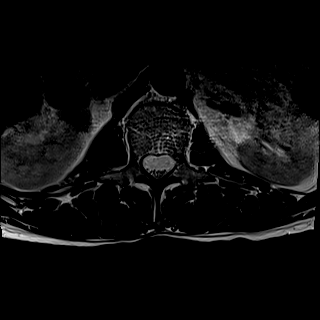

[Series 8: T1 · axial · 4.0mm · 0.39mm/px · z∈[-147,+58]mm · 8 of 35 slices shown (2 of 2)]
[im 1/35]
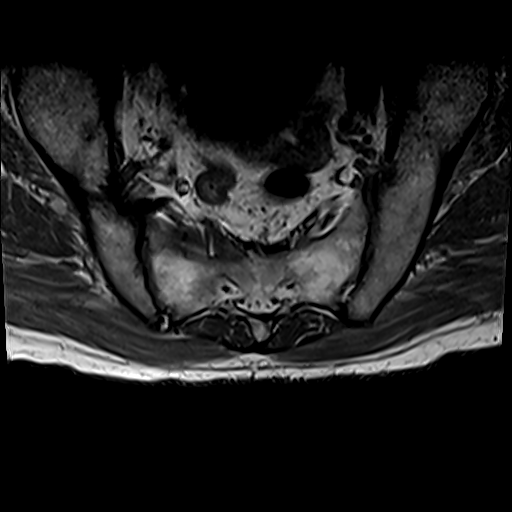
[im 4/35]
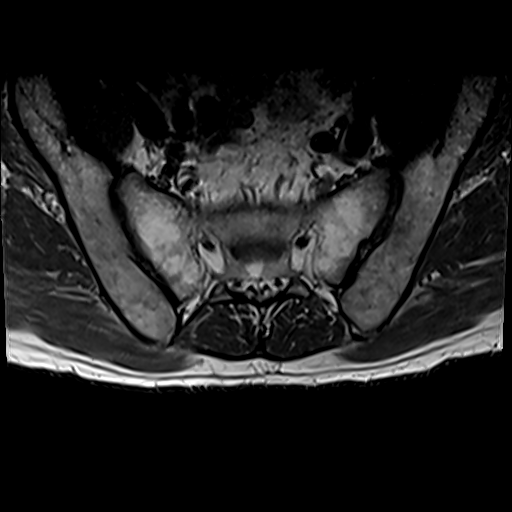
[im 12/35]
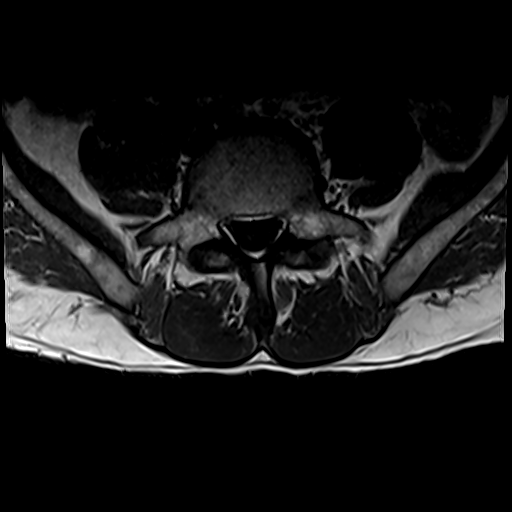
[im 16/35]
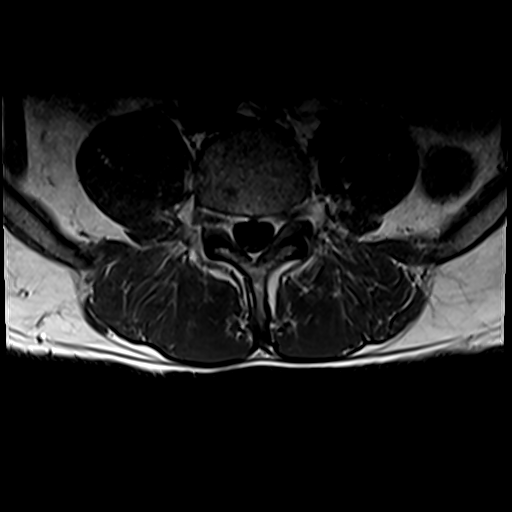
[im 19/35]
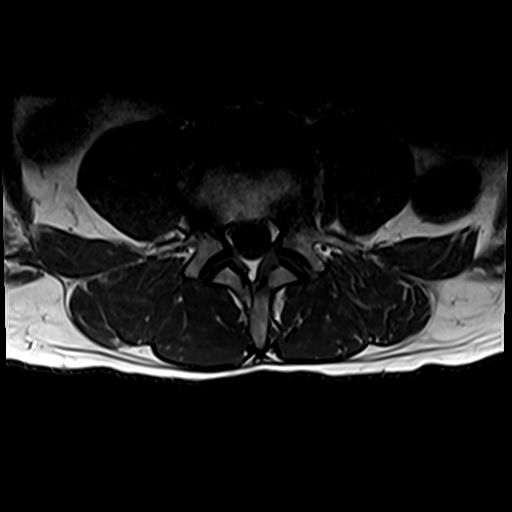
[im 23/35]
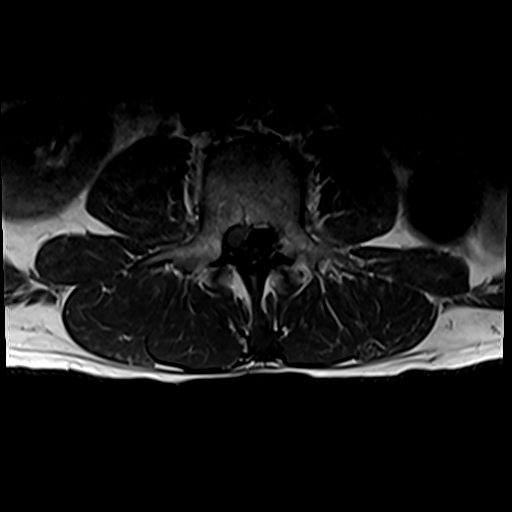
[im 31/35]
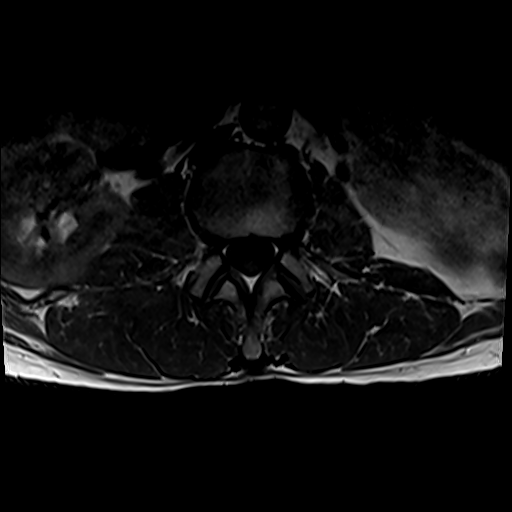
[im 35/35]
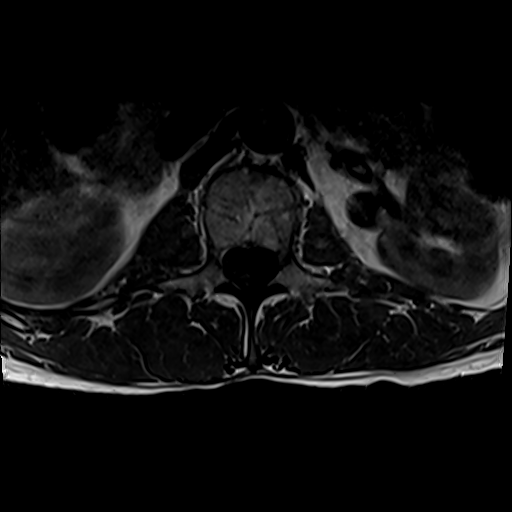

[Series 9: T2 post-contrast · sagittal · 4.0mm · 0.81mm/px · 4 of 15 slices shown]
[im 1/15]
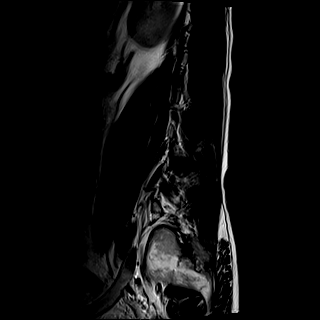
[im 5/15]
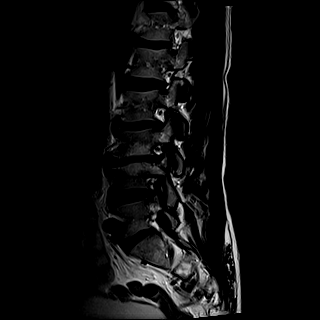
[im 10/15]
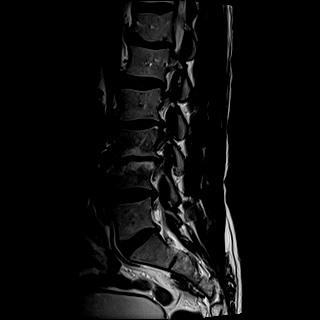
[im 15/15]
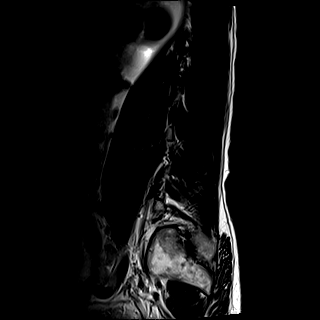

[Series 10: T1 fat-sat post-contrast · sagittal · 4.0mm · 0.81mm/px · 4 of 15 slices shown]
[im 1/15]
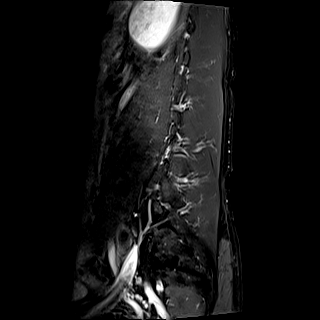
[im 5/15]
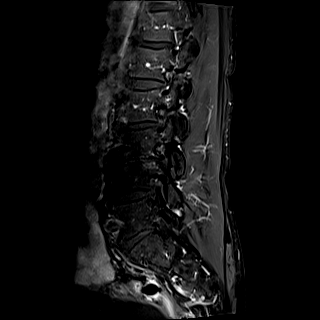
[im 10/15]
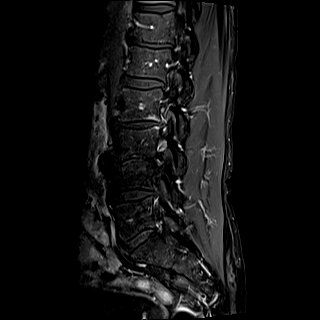
[im 15/15]
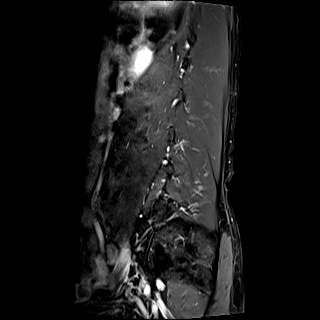

[30 of 48 positions shown; findings below may reference images not displayed]

FINDINGS: Segmentation: There are five lumbar type vertebral bodies. The last
full intervertebral disc space is labeled L5-S1.

Alignment:  Normal overall alignment of the lumbar vertebral bodies.

Vertebrae: Moderate endplate reactive changes at L3-4 no bone
lesions or fractures.

Conus medullaris and cauda equina: Conus extends to the bottom of
T12 level. Conus and cauda equina appear normal.

Paraspinal and other soft tissues: No significant paraspinal or
retroperitoneal findings.

Disc levels:

L1-2: Mild/early facet disease but no disc protrusions, spinal or
foraminal stenosis.

L2-3: There is a broad-based disc protrusion with flattening of the
ventral thecal sac and mild bilateral lateral recess stenosis. There
is also a moderate to large right-sided disc extrusion with probable
free fragment coursing down behind the L3 vertebral body. There is
mass effect on the right side of the thecal sac likely affecting the
right L3 nerve root. The sequestered fragment extends down to just
above the L3-4 disc space level. No foraminal stenosis.

L3-4: Advanced degenerative disc disease with a bulging degenerated
annulus, osteophytic spurring and endplate reactive changes. There
is flattening of the ventral thecal sac and mild bilateral lateral
recess stenosis. No significant foraminal stenosis or significant
spinal stenosis. Mild facet disease with mild ligamentum flavum
thickening.

L4-5: Bulging annulus and moderate facet disease with mild
ligamentum flavum thickening but no significant spinal or lateral
recess stenosis. There is a focal shallow right
foraminal/extraforaminal disc protrusion which comes close to
contacting and may slightly displace the right L4 nerve root extra
foraminally.

L5-S1: No significant findings.
IMPRESSION: 1. There is a moderate to large right-sided disc extrusion at L2-3
with probable free fragment coursing down behind the L3 vertebral
body. There is significant mass effect on the right side of the
thecal sac likely affecting the right L3 nerve root.
2. Mild bilateral lateral recess stenosis at L3-4.
3. Focal shallow right foraminal/extraforaminal disc protrusion at
L4-5 which comes close to contacting and may slightly displace the
right L4 nerve root extra foraminally.

## 2021-09-16 DIAGNOSIS — E785 Hyperlipidemia, unspecified: Secondary | ICD-10-CM | POA: Diagnosis not present

## 2021-09-16 DIAGNOSIS — Z125 Encounter for screening for malignant neoplasm of prostate: Secondary | ICD-10-CM | POA: Diagnosis not present

## 2021-09-16 DIAGNOSIS — Z0000 Encounter for general adult medical examination without abnormal findings: Secondary | ICD-10-CM | POA: Diagnosis not present

## 2021-09-16 DIAGNOSIS — Z1331 Encounter for screening for depression: Secondary | ICD-10-CM | POA: Diagnosis not present

## 2021-09-16 DIAGNOSIS — I10 Essential (primary) hypertension: Secondary | ICD-10-CM | POA: Diagnosis not present

## 2021-09-16 DIAGNOSIS — Z1339 Encounter for screening examination for other mental health and behavioral disorders: Secondary | ICD-10-CM | POA: Diagnosis not present

## 2021-09-16 DIAGNOSIS — R82998 Other abnormal findings in urine: Secondary | ICD-10-CM | POA: Diagnosis not present

## 2022-03-24 ENCOUNTER — Telehealth: Payer: Self-pay | Admitting: Diagnostic Neuroimaging

## 2022-03-24 DIAGNOSIS — I10 Essential (primary) hypertension: Secondary | ICD-10-CM | POA: Diagnosis not present

## 2022-03-24 NOTE — Telephone Encounter (Signed)
This is FYI for Maralyn Sago, NP and RN Pt called stating his PCP suggested he call for a Neuropathy f/u with Dr Marjory Lies.  Dr Marjory Lies does not have anything available before the month of Feb'24.  Pt states the reason for the appointment is due to balance concerns mentioned by PCP. Pt accepted a f/u with NP for tomorrow for 9:45 he is aware to check in at 9:15

## 2022-03-25 ENCOUNTER — Ambulatory Visit: Payer: BC Managed Care – PPO | Admitting: Neurology

## 2022-03-25 ENCOUNTER — Encounter: Payer: Self-pay | Admitting: Neurology

## 2022-03-25 VITALS — BP 160/92 | HR 63 | Ht 68.0 in | Wt 140.5 lb

## 2022-03-25 DIAGNOSIS — R269 Unspecified abnormalities of gait and mobility: Secondary | ICD-10-CM

## 2022-03-25 NOTE — Patient Instructions (Signed)
Check MRI of the brain

## 2022-03-25 NOTE — Progress Notes (Signed)
Patient: Jesus Sparks Date of Birth: 19-Jan-1956  Reason for Visit: Follow up History from: Patient Primary Neurologist: Dr. Marjory Lies   ASSESSMENT AND PLAN 66 y.o. year old male   1.  Idiopathic polyneuropathy 2.  Worsening balance, gait 3.  Speech changes  -Check MRI of the brain with and without contrast -Once MRI results, will have patient come back to see Dr. Marjory Lies for evaluation  HISTORY Update 03/25/22 SS: He feels balance is worsening. Hard time walking on the sand. At baseline, has to be conscious to speak clearly. Lately has been asked to repeat himself. He is a Scientist, forensic. Has exaggerated swallowing, denies any issues with this. When in darkness has no balance. Has to brace his hand if he is walking. Played golf. No tremor. Has some difficulty with fine motor skills, buttons, putting cap on plastic bottle. Takes him longer to tie his shoes struggles with making the ties. Denies any numbness to fingers.   UPDATE (02/04/21, VRP): Since last visit, doing about the same, except had right lumbar disc herniation in May 2022, confirmed on MRI, then had surgery 01/07/21. Has had good recovery. Balance issues are stable.    UPDATE (06/04/19, VRP): Since last visit, doing about the same. Symptoms are progressive. Severity is moderate. No alleviating or aggravating factors. Numbness and gait diff continue. Also having more smell and taste dysfunction. Symptoms started 5-10 years ago.      PRIOR HPI (02/07/19): 66 year old male here for evaluation of gait and balance difficulty.   2015 patient had onset of gait and balance difficulty.  1 night he got up from sleep to go to the bathroom in the dark and fell down.  Ever since that time he has noticed difficulty with walking in the dark.  He is also had to hold onto objects for balance and coordination.  Patient went to ENT for evaluation and was diagnosed with bilateral vestibular dysfunction.  No specific cause was  found.   Patient also has been found to have lack of sensation in his lower extremities below his knees.  He denies any numbness or tingling or pain.  Denies any low back pain.  Denies any muscle weakness.   Patient has had multiple falls as a result.  No hearing loss.  No vertigo.  No headache.  REVIEW OF SYSTEMS: Out of a complete 14 system review of symptoms, the patient complains only of the following symptoms, and all other reviewed systems are negative.  See HPI  ALLERGIES: No Known Allergies  HOME MEDICATIONS: Outpatient Medications Prior to Visit  Medication Sig Dispense Refill   loratadine (CLARITIN) 10 MG tablet Take 10 mg by mouth daily.     Cyanocobalamin (VITAMIN B-12 CR) 1000 MCG TBCR Take by mouth. (Patient not taking: Reported on 02/04/2021)     losartan (COZAAR) 50 MG tablet Take 50 mg by mouth daily.     rosuvastatin (CRESTOR) 10 MG tablet TK 1 T PO QD     No facility-administered medications prior to visit.    PAST MEDICAL HISTORY: Past Medical History:  Diagnosis Date   Axonal sensorimotor neuropathy    per EMG   Falls    Hypercholesterolemia    Hypertension    Labyrinthine dysfunction    Vertigo of central origin    Vestibular hypofunction     PAST SURGICAL HISTORY: Past Surgical History:  Procedure Laterality Date   BACK SURGERY  12/2020   herniated disk   TONSILLECTOMY  1966  child    FAMILY HISTORY: Family History  Problem Relation Age of Onset   Pulmonary fibrosis Father     SOCIAL HISTORY: Social History   Socioeconomic History   Marital status: Married    Spouse name: Verlon Au   Number of children: 4   Years of education: Not on file   Highest education level: Master's degree (e.g., MA, MS, MEng, MEd, MSW, MBA)  Occupational History   Occupation: Runner, broadcasting/film/video    Comment: [employer]  Tobacco Use   Smoking status: Never   Smokeless tobacco: Never  Substance and Sexual Activity   Alcohol use: Not Currently   Drug use:  Never   Sexual activity: Not on file  Other Topics Concern   Not on file  Social History Narrative   Lives with wife   Caffeine- coffee, 3 cups daily   Right-handed.   Social Determinants of Health   Financial Resource Strain: Not on file  Food Insecurity: Not on file  Transportation Needs: Not on file  Physical Activity: Not on file  Stress: Not on file  Social Connections: Not on file  Intimate Partner Violence: Not on file    PHYSICAL EXAM  Vitals:   03/25/22 0953  BP: (!) 160/92  Pulse: 63  Weight: 140 lb 8 oz (63.7 kg)  Height: 5\' 8"  (1.727 m)   Body mass index is 21.36 kg/m.  Generalized: Well developed, in no acute distress  Neurological examination  Mentation: Alert oriented to time, place, history taking. Follows all commands, speech is mumbled, moderately difficult to understand, certain words are slurred/wet Cranial nerve II-XII: Pupils were equal round reactive to light. Extraocular movements were full, visual field were full on confrontational test. Facial sensation and strength were normal. Head turning and shoulder shrug were normal and symmetric.  Has exaggerated swallowing that is audible Motor: Good strength all extremities, exception decreased dorsiflexion right foot 4/5.  Somewhat fidgety. Sensory: Decreased pinprick sensation to right leg to knee level, left is ankle; vibration sensation is intact.  Normal sensation upper extremities Coordination: Cerebellar testing reveals good finger-nose-finger and heel-to-shin bilaterally.  Is intentional with finger-nose-finger. Gait and station: Gait is unsteady, has a tendency to lean backwards, walks flat-footed, has to touch the wall for stability, cannot perform tandem gait Reflexes: Deep tendon reflexes are symmetric and normal bilaterally.   DIAGNOSTIC DATA (LABS, IMAGING, TESTING) - I reviewed patient records, labs, notes, testing and imaging myself where available.  No results found for: "WBC", "HGB",  "HCT", "MCV", "PLT"    Component Value Date/Time   PROT 6.6 02/08/2019 1118   No results found for: "CHOL", "HDL", "LDLCALC", "LDLDIRECT", "TRIG", "CHOLHDL" Lab Results  Component Value Date   HGBA1C 5.7 (H) 02/08/2019   Lab Results  Component Value Date   VITAMINB12 268 02/08/2019   Lab Results  Component Value Date   TSH 1.360 02/08/2019    02/10/2019, AGNP-C, DNP 03/25/2022, 10:35 AM [facility] [address], [address] [place], [place] [phone]

## 2022-03-30 ENCOUNTER — Telehealth: Payer: Self-pay | Admitting: Neurology

## 2022-03-30 NOTE — Telephone Encounter (Signed)
[employer] NPR via website sent to GI 

## 2022-04-05 NOTE — Telephone Encounter (Signed)
Patient left me a message about his MRI

## 2022-04-06 ENCOUNTER — Ambulatory Visit
Admission: RE | Admit: 2022-04-06 | Discharge: 2022-04-06 | Disposition: A | Discharge status: Home / Self Care | Payer: BC Managed Care – PPO | Source: Ambulatory Visit | Attending: Neurology | Admitting: Neurology

## 2022-04-06 DIAGNOSIS — R269 Unspecified abnormalities of gait and mobility: Secondary | ICD-10-CM

## 2022-04-06 MED ORDER — GADOBENATE DIMEGLUMINE 529 MG/ML IV SOLN
13.0000 mL | Freq: Once | INTRAVENOUS | Status: AC | PRN
  Administered 2022-04-06: 13 mL via INTRAVENOUS

## 2022-04-07 ENCOUNTER — Telehealth: Payer: Self-pay | Admitting: Neurology

## 2022-04-07 NOTE — Telephone Encounter (Signed)
Please call the patient, recent MRI of the brain with and without contrast showed overall no acute findings to explain worsening balance or gait.  Please schedule the patient with Dr. Marjory Lies at his next available.  MRI brain with and without contrast imaging: - Mild scattered periventricular subcortical foci of nonspecific gliosis.  - No acute findings.

## 2023-01-05 DIAGNOSIS — I10 Essential (primary) hypertension: Secondary | ICD-10-CM | POA: Diagnosis not present

## 2023-01-05 DIAGNOSIS — E785 Hyperlipidemia, unspecified: Secondary | ICD-10-CM | POA: Diagnosis not present

## 2023-01-12 DIAGNOSIS — R200 Anesthesia of skin: Secondary | ICD-10-CM | POA: Diagnosis not present

## 2023-01-12 DIAGNOSIS — R82998 Other abnormal findings in urine: Secondary | ICD-10-CM | POA: Diagnosis not present

## 2023-01-12 DIAGNOSIS — E785 Hyperlipidemia, unspecified: Secondary | ICD-10-CM | POA: Diagnosis not present

## 2023-01-12 DIAGNOSIS — R270 Ataxia, unspecified: Secondary | ICD-10-CM | POA: Diagnosis not present

## 2023-01-12 DIAGNOSIS — I10 Essential (primary) hypertension: Secondary | ICD-10-CM | POA: Diagnosis not present

## 2023-01-12 DIAGNOSIS — G608 Other hereditary and idiopathic neuropathies: Secondary | ICD-10-CM | POA: Diagnosis not present

## 2023-01-12 DIAGNOSIS — Z0000 Encounter for general adult medical examination without abnormal findings: Secondary | ICD-10-CM | POA: Diagnosis not present

## 2023-01-12 DIAGNOSIS — Z1331 Encounter for screening for depression: Secondary | ICD-10-CM | POA: Diagnosis not present

## 2023-01-12 DIAGNOSIS — R471 Dysarthria and anarthria: Secondary | ICD-10-CM | POA: Diagnosis not present

## 2023-03-28 DIAGNOSIS — H8190 Unspecified disorder of vestibular function, unspecified ear: Secondary | ICD-10-CM | POA: Diagnosis not present

## 2023-03-28 DIAGNOSIS — G609 Hereditary and idiopathic neuropathy, unspecified: Secondary | ICD-10-CM | POA: Diagnosis not present

## 2023-03-28 DIAGNOSIS — R270 Ataxia, unspecified: Secondary | ICD-10-CM | POA: Diagnosis not present

## 2023-03-29 DIAGNOSIS — R2689 Other abnormalities of gait and mobility: Secondary | ICD-10-CM | POA: Diagnosis not present

## 2023-03-29 DIAGNOSIS — G629 Polyneuropathy, unspecified: Secondary | ICD-10-CM | POA: Diagnosis not present

## 2023-04-05 DIAGNOSIS — G549 Nerve root and plexus disorder, unspecified: Secondary | ICD-10-CM | POA: Diagnosis not present

## 2023-04-07 DIAGNOSIS — K080 Exfoliation of teeth due to systemic causes: Secondary | ICD-10-CM | POA: Diagnosis not present

## 2023-04-30 DIAGNOSIS — M4712 Other spondylosis with myelopathy, cervical region: Secondary | ICD-10-CM | POA: Diagnosis not present

## 2023-05-11 DIAGNOSIS — R270 Ataxia, unspecified: Secondary | ICD-10-CM | POA: Diagnosis not present

## 2023-05-11 DIAGNOSIS — H8190 Unspecified disorder of vestibular function, unspecified ear: Secondary | ICD-10-CM | POA: Diagnosis not present

## 2023-05-11 DIAGNOSIS — G549 Nerve root and plexus disorder, unspecified: Secondary | ICD-10-CM | POA: Diagnosis not present

## 2023-05-16 DIAGNOSIS — K080 Exfoliation of teeth due to systemic causes: Secondary | ICD-10-CM | POA: Diagnosis not present

## 2023-05-31 DIAGNOSIS — I6523 Occlusion and stenosis of bilateral carotid arteries: Secondary | ICD-10-CM | POA: Diagnosis not present

## 2023-05-31 DIAGNOSIS — R42 Dizziness and giddiness: Secondary | ICD-10-CM | POA: Diagnosis not present

## 2023-05-31 DIAGNOSIS — I6502 Occlusion and stenosis of left vertebral artery: Secondary | ICD-10-CM | POA: Diagnosis not present

## 2023-05-31 DIAGNOSIS — R270 Ataxia, unspecified: Secondary | ICD-10-CM | POA: Diagnosis not present

## 2023-05-31 DIAGNOSIS — I6782 Cerebral ischemia: Secondary | ICD-10-CM | POA: Diagnosis not present

## 2023-07-06 DIAGNOSIS — G549 Nerve root and plexus disorder, unspecified: Secondary | ICD-10-CM | POA: Diagnosis not present

## 2023-07-06 DIAGNOSIS — H8190 Unspecified disorder of vestibular function, unspecified ear: Secondary | ICD-10-CM | POA: Diagnosis not present

## 2023-07-06 DIAGNOSIS — R270 Ataxia, unspecified: Secondary | ICD-10-CM | POA: Diagnosis not present

## 2023-07-27 DIAGNOSIS — I10 Essential (primary) hypertension: Secondary | ICD-10-CM | POA: Diagnosis not present

## 2023-08-24 DIAGNOSIS — R270 Ataxia, unspecified: Secondary | ICD-10-CM | POA: Diagnosis not present

## 2023-08-24 DIAGNOSIS — R471 Dysarthria and anarthria: Secondary | ICD-10-CM | POA: Diagnosis not present

## 2023-08-31 DIAGNOSIS — R279 Unspecified lack of coordination: Secondary | ICD-10-CM | POA: Diagnosis not present

## 2023-08-31 DIAGNOSIS — R270 Ataxia, unspecified: Secondary | ICD-10-CM | POA: Diagnosis not present

## 2023-09-21 DIAGNOSIS — M6281 Muscle weakness (generalized): Secondary | ICD-10-CM | POA: Diagnosis not present

## 2023-09-21 DIAGNOSIS — R270 Ataxia, unspecified: Secondary | ICD-10-CM | POA: Diagnosis not present

## 2023-09-21 DIAGNOSIS — R279 Unspecified lack of coordination: Secondary | ICD-10-CM | POA: Diagnosis not present

## 2023-09-21 DIAGNOSIS — Z7409 Other reduced mobility: Secondary | ICD-10-CM | POA: Diagnosis not present

## 2023-10-13 DIAGNOSIS — Z7409 Other reduced mobility: Secondary | ICD-10-CM | POA: Diagnosis not present

## 2023-10-13 DIAGNOSIS — M6281 Muscle weakness (generalized): Secondary | ICD-10-CM | POA: Diagnosis not present

## 2023-10-13 DIAGNOSIS — R279 Unspecified lack of coordination: Secondary | ICD-10-CM | POA: Diagnosis not present

## 2023-10-13 DIAGNOSIS — R270 Ataxia, unspecified: Secondary | ICD-10-CM | POA: Diagnosis not present

## 2023-10-19 DIAGNOSIS — M6281 Muscle weakness (generalized): Secondary | ICD-10-CM | POA: Diagnosis not present

## 2023-10-19 DIAGNOSIS — Z7409 Other reduced mobility: Secondary | ICD-10-CM | POA: Diagnosis not present

## 2023-10-19 DIAGNOSIS — R279 Unspecified lack of coordination: Secondary | ICD-10-CM | POA: Diagnosis not present

## 2023-10-19 DIAGNOSIS — R270 Ataxia, unspecified: Secondary | ICD-10-CM | POA: Diagnosis not present

## 2023-10-26 DIAGNOSIS — R270 Ataxia, unspecified: Secondary | ICD-10-CM | POA: Diagnosis not present

## 2023-10-26 DIAGNOSIS — Z7409 Other reduced mobility: Secondary | ICD-10-CM | POA: Diagnosis not present

## 2023-10-26 DIAGNOSIS — M6281 Muscle weakness (generalized): Secondary | ICD-10-CM | POA: Diagnosis not present

## 2023-10-26 DIAGNOSIS — R279 Unspecified lack of coordination: Secondary | ICD-10-CM | POA: Diagnosis not present

## 2023-11-02 DIAGNOSIS — Z7409 Other reduced mobility: Secondary | ICD-10-CM | POA: Diagnosis not present

## 2023-11-02 DIAGNOSIS — R279 Unspecified lack of coordination: Secondary | ICD-10-CM | POA: Diagnosis not present

## 2023-11-02 DIAGNOSIS — R270 Ataxia, unspecified: Secondary | ICD-10-CM | POA: Diagnosis not present

## 2023-11-02 DIAGNOSIS — M6281 Muscle weakness (generalized): Secondary | ICD-10-CM | POA: Diagnosis not present

## 2023-11-09 DIAGNOSIS — R279 Unspecified lack of coordination: Secondary | ICD-10-CM | POA: Diagnosis not present

## 2023-11-09 DIAGNOSIS — R270 Ataxia, unspecified: Secondary | ICD-10-CM | POA: Diagnosis not present

## 2023-11-09 DIAGNOSIS — M6281 Muscle weakness (generalized): Secondary | ICD-10-CM | POA: Diagnosis not present

## 2023-11-09 DIAGNOSIS — Z7409 Other reduced mobility: Secondary | ICD-10-CM | POA: Diagnosis not present

## 2023-11-16 DIAGNOSIS — R270 Ataxia, unspecified: Secondary | ICD-10-CM | POA: Diagnosis not present

## 2023-11-16 DIAGNOSIS — Z7409 Other reduced mobility: Secondary | ICD-10-CM | POA: Diagnosis not present

## 2023-11-16 DIAGNOSIS — M6281 Muscle weakness (generalized): Secondary | ICD-10-CM | POA: Diagnosis not present

## 2023-11-16 DIAGNOSIS — R279 Unspecified lack of coordination: Secondary | ICD-10-CM | POA: Diagnosis not present

## 2023-11-23 DIAGNOSIS — G112 Late-onset cerebellar ataxia: Secondary | ICD-10-CM | POA: Diagnosis not present

## 2023-11-23 DIAGNOSIS — R279 Unspecified lack of coordination: Secondary | ICD-10-CM | POA: Diagnosis not present

## 2023-11-23 DIAGNOSIS — M6281 Muscle weakness (generalized): Secondary | ICD-10-CM | POA: Diagnosis not present

## 2023-11-23 DIAGNOSIS — G602 Neuropathy in association with hereditary ataxia: Secondary | ICD-10-CM | POA: Diagnosis not present

## 2023-11-23 DIAGNOSIS — R270 Ataxia, unspecified: Secondary | ICD-10-CM | POA: Diagnosis not present

## 2023-11-23 DIAGNOSIS — Z7409 Other reduced mobility: Secondary | ICD-10-CM | POA: Diagnosis not present

## 2023-11-23 DIAGNOSIS — R922 Inconclusive mammogram: Secondary | ICD-10-CM | POA: Diagnosis not present

## 2023-11-30 DIAGNOSIS — R270 Ataxia, unspecified: Secondary | ICD-10-CM | POA: Diagnosis not present

## 2023-11-30 DIAGNOSIS — R279 Unspecified lack of coordination: Secondary | ICD-10-CM | POA: Diagnosis not present

## 2023-11-30 DIAGNOSIS — M6281 Muscle weakness (generalized): Secondary | ICD-10-CM | POA: Diagnosis not present

## 2023-11-30 DIAGNOSIS — Z7409 Other reduced mobility: Secondary | ICD-10-CM | POA: Diagnosis not present

## 2023-12-14 DIAGNOSIS — Z7409 Other reduced mobility: Secondary | ICD-10-CM | POA: Diagnosis not present

## 2023-12-14 DIAGNOSIS — R270 Ataxia, unspecified: Secondary | ICD-10-CM | POA: Diagnosis not present

## 2023-12-14 DIAGNOSIS — R279 Unspecified lack of coordination: Secondary | ICD-10-CM | POA: Diagnosis not present

## 2023-12-14 DIAGNOSIS — M6281 Muscle weakness (generalized): Secondary | ICD-10-CM | POA: Diagnosis not present

## 2023-12-21 DIAGNOSIS — R279 Unspecified lack of coordination: Secondary | ICD-10-CM | POA: Diagnosis not present

## 2023-12-21 DIAGNOSIS — R270 Ataxia, unspecified: Secondary | ICD-10-CM | POA: Diagnosis not present

## 2023-12-21 DIAGNOSIS — M6281 Muscle weakness (generalized): Secondary | ICD-10-CM | POA: Diagnosis not present

## 2023-12-21 DIAGNOSIS — Z7409 Other reduced mobility: Secondary | ICD-10-CM | POA: Diagnosis not present

## 2024-01-13 DIAGNOSIS — Z125 Encounter for screening for malignant neoplasm of prostate: Secondary | ICD-10-CM | POA: Diagnosis not present

## 2024-01-13 DIAGNOSIS — E785 Hyperlipidemia, unspecified: Secondary | ICD-10-CM | POA: Diagnosis not present

## 2024-01-18 DIAGNOSIS — Z0000 Encounter for general adult medical examination without abnormal findings: Secondary | ICD-10-CM | POA: Diagnosis not present

## 2024-01-18 DIAGNOSIS — I10 Essential (primary) hypertension: Secondary | ICD-10-CM | POA: Diagnosis not present

## 2024-01-18 DIAGNOSIS — R82998 Other abnormal findings in urine: Secondary | ICD-10-CM | POA: Diagnosis not present

## 2024-02-08 DIAGNOSIS — R922 Inconclusive mammogram: Secondary | ICD-10-CM | POA: Diagnosis not present

## 2024-02-08 DIAGNOSIS — G602 Neuropathy in association with hereditary ataxia: Secondary | ICD-10-CM | POA: Diagnosis not present

## 2024-02-08 DIAGNOSIS — G112 Late-onset cerebellar ataxia: Secondary | ICD-10-CM | POA: Diagnosis not present

## 2024-03-21 DIAGNOSIS — R1312 Dysphagia, oropharyngeal phase: Secondary | ICD-10-CM | POA: Diagnosis not present

## 2024-03-21 DIAGNOSIS — G602 Neuropathy in association with hereditary ataxia: Secondary | ICD-10-CM | POA: Diagnosis not present

## 2024-03-21 DIAGNOSIS — R279 Unspecified lack of coordination: Secondary | ICD-10-CM | POA: Diagnosis not present

## 2024-03-21 DIAGNOSIS — G112 Late-onset cerebellar ataxia: Secondary | ICD-10-CM | POA: Diagnosis not present

## 2024-03-21 DIAGNOSIS — M6281 Muscle weakness (generalized): Secondary | ICD-10-CM | POA: Diagnosis not present

## 2024-03-21 DIAGNOSIS — R270 Ataxia, unspecified: Secondary | ICD-10-CM | POA: Diagnosis not present

## 2024-03-21 DIAGNOSIS — Z7409 Other reduced mobility: Secondary | ICD-10-CM | POA: Diagnosis not present

## 2024-03-21 DIAGNOSIS — R922 Inconclusive mammogram: Secondary | ICD-10-CM | POA: Diagnosis not present

## 2024-05-22 DIAGNOSIS — R292 Abnormal reflex: Secondary | ICD-10-CM | POA: Diagnosis not present

## 2024-05-22 DIAGNOSIS — R270 Ataxia, unspecified: Secondary | ICD-10-CM | POA: Diagnosis not present

## 2024-05-22 DIAGNOSIS — R471 Dysarthria and anarthria: Secondary | ICD-10-CM | POA: Diagnosis not present

## 2024-05-22 DIAGNOSIS — R279 Unspecified lack of coordination: Secondary | ICD-10-CM | POA: Diagnosis not present

## 2024-05-22 DIAGNOSIS — Z7409 Other reduced mobility: Secondary | ICD-10-CM | POA: Diagnosis not present

## 2024-05-22 DIAGNOSIS — M6281 Muscle weakness (generalized): Secondary | ICD-10-CM | POA: Diagnosis not present

## 2024-05-22 DIAGNOSIS — R922 Inconclusive mammogram: Secondary | ICD-10-CM | POA: Diagnosis not present

## 2024-05-22 DIAGNOSIS — R1312 Dysphagia, oropharyngeal phase: Secondary | ICD-10-CM | POA: Diagnosis not present

## 2024-05-22 DIAGNOSIS — G112 Late-onset cerebellar ataxia: Secondary | ICD-10-CM | POA: Diagnosis not present

## 2024-05-22 DIAGNOSIS — G602 Neuropathy in association with hereditary ataxia: Secondary | ICD-10-CM | POA: Diagnosis not present

## 2024-06-06 DIAGNOSIS — R279 Unspecified lack of coordination: Secondary | ICD-10-CM | POA: Diagnosis not present

## 2024-06-06 DIAGNOSIS — R471 Dysarthria and anarthria: Secondary | ICD-10-CM | POA: Diagnosis not present

## 2024-06-06 DIAGNOSIS — G112 Late-onset cerebellar ataxia: Secondary | ICD-10-CM | POA: Diagnosis not present

## 2024-06-06 DIAGNOSIS — G602 Neuropathy in association with hereditary ataxia: Secondary | ICD-10-CM | POA: Diagnosis not present

## 2024-06-06 DIAGNOSIS — Z7409 Other reduced mobility: Secondary | ICD-10-CM | POA: Diagnosis not present

## 2024-06-06 DIAGNOSIS — M6281 Muscle weakness (generalized): Secondary | ICD-10-CM | POA: Diagnosis not present

## 2024-06-06 DIAGNOSIS — R270 Ataxia, unspecified: Secondary | ICD-10-CM | POA: Diagnosis not present

## 2024-06-06 DIAGNOSIS — R922 Inconclusive mammogram: Secondary | ICD-10-CM | POA: Diagnosis not present

## 2024-06-06 DIAGNOSIS — R1312 Dysphagia, oropharyngeal phase: Secondary | ICD-10-CM | POA: Diagnosis not present

## 2024-06-06 DIAGNOSIS — R292 Abnormal reflex: Secondary | ICD-10-CM | POA: Diagnosis not present

## 2024-06-13 DIAGNOSIS — R1312 Dysphagia, oropharyngeal phase: Secondary | ICD-10-CM | POA: Diagnosis not present

## 2024-06-13 DIAGNOSIS — R471 Dysarthria and anarthria: Secondary | ICD-10-CM | POA: Diagnosis not present

## 2024-06-13 DIAGNOSIS — R922 Inconclusive mammogram: Secondary | ICD-10-CM | POA: Diagnosis not present

## 2024-06-13 DIAGNOSIS — M6281 Muscle weakness (generalized): Secondary | ICD-10-CM | POA: Diagnosis not present

## 2024-06-13 DIAGNOSIS — R279 Unspecified lack of coordination: Secondary | ICD-10-CM | POA: Diagnosis not present

## 2024-06-13 DIAGNOSIS — Z7409 Other reduced mobility: Secondary | ICD-10-CM | POA: Diagnosis not present

## 2024-06-13 DIAGNOSIS — R270 Ataxia, unspecified: Secondary | ICD-10-CM | POA: Diagnosis not present

## 2024-06-13 DIAGNOSIS — R292 Abnormal reflex: Secondary | ICD-10-CM | POA: Diagnosis not present

## 2024-06-13 DIAGNOSIS — G602 Neuropathy in association with hereditary ataxia: Secondary | ICD-10-CM | POA: Diagnosis not present

## 2024-06-13 DIAGNOSIS — G112 Late-onset cerebellar ataxia: Secondary | ICD-10-CM | POA: Diagnosis not present

## 2024-06-19 DIAGNOSIS — R270 Ataxia, unspecified: Secondary | ICD-10-CM | POA: Diagnosis not present

## 2024-06-19 DIAGNOSIS — R471 Dysarthria and anarthria: Secondary | ICD-10-CM | POA: Diagnosis not present

## 2024-06-19 DIAGNOSIS — R1312 Dysphagia, oropharyngeal phase: Secondary | ICD-10-CM | POA: Diagnosis not present

## 2024-06-19 DIAGNOSIS — G602 Neuropathy in association with hereditary ataxia: Secondary | ICD-10-CM | POA: Diagnosis not present

## 2024-06-19 DIAGNOSIS — R292 Abnormal reflex: Secondary | ICD-10-CM | POA: Diagnosis not present

## 2024-06-19 DIAGNOSIS — R922 Inconclusive mammogram: Secondary | ICD-10-CM | POA: Diagnosis not present

## 2024-06-19 DIAGNOSIS — G112 Late-onset cerebellar ataxia: Secondary | ICD-10-CM | POA: Diagnosis not present

## 2024-06-19 DIAGNOSIS — R279 Unspecified lack of coordination: Secondary | ICD-10-CM | POA: Diagnosis not present

## 2024-06-19 DIAGNOSIS — Z7409 Other reduced mobility: Secondary | ICD-10-CM | POA: Diagnosis not present

## 2024-06-19 DIAGNOSIS — M6281 Muscle weakness (generalized): Secondary | ICD-10-CM | POA: Diagnosis not present

## 2024-06-21 DIAGNOSIS — H524 Presbyopia: Secondary | ICD-10-CM | POA: Diagnosis not present

## 2024-07-05 NOTE — Progress Notes (Signed)
 Jesus Sparks                                          MRN: [id number]   07/05/2024   The [facility] Quality Team Specialist reviewed this patient medical record for the purposes of chart review for care gap closure. The following were reviewed: chart review for care gap closure-controlling blood pressure.    [facility] Quality Team
